# Patient Record
Sex: Male | Born: 1970 | Race: White | Hispanic: No | Marital: Single | Smoking: Current every day smoker
Health system: Southern US, Community
[De-identification: ages and names within clinical notes are randomized; demographics above are authoritative.]

## PROBLEM LIST (undated history)

## (undated) DIAGNOSIS — Z720 Tobacco use: Secondary | ICD-10-CM

## (undated) DIAGNOSIS — R002 Palpitations: Secondary | ICD-10-CM

## (undated) DIAGNOSIS — I10 Essential (primary) hypertension: Secondary | ICD-10-CM

## (undated) DIAGNOSIS — E663 Overweight: Secondary | ICD-10-CM

## (undated) HISTORY — DX: Tobacco use: Z72.0

## (undated) HISTORY — DX: Overweight: E66.3

## (undated) HISTORY — DX: Palpitations: R00.2

## (undated) HISTORY — PX: KNEE ARTHROSCOPY W/ ACL RECONSTRUCTION: SHX1858

---

## 2002-12-03 DIAGNOSIS — I10 Essential (primary) hypertension: Secondary | ICD-10-CM

## 2002-12-03 HISTORY — DX: Essential (primary) hypertension: I10

## 2006-12-03 DIAGNOSIS — Z720 Tobacco use: Secondary | ICD-10-CM

## 2006-12-03 HISTORY — DX: Tobacco use: Z72.0

## 2011-12-04 DIAGNOSIS — R002 Palpitations: Secondary | ICD-10-CM

## 2011-12-04 HISTORY — DX: Palpitations: R00.2

## 2012-01-13 ENCOUNTER — Other Ambulatory Visit: Payer: Self-pay

## 2012-01-13 ENCOUNTER — Emergency Department (HOSPITAL_COMMUNITY)
Admission: EM | Admit: 2012-01-13 | Discharge: 2012-01-14 | Disposition: A | Discharge status: Home / Self Care | Payer: Managed Care, Other (non HMO) | Attending: Emergency Medicine | Admitting: Emergency Medicine

## 2012-01-13 ENCOUNTER — Encounter (HOSPITAL_COMMUNITY): Payer: Self-pay

## 2012-01-13 ENCOUNTER — Emergency Department (HOSPITAL_COMMUNITY): Payer: Managed Care, Other (non HMO)

## 2012-01-13 DIAGNOSIS — R002 Palpitations: Secondary | ICD-10-CM

## 2012-01-13 DIAGNOSIS — I10 Essential (primary) hypertension: Secondary | ICD-10-CM | POA: Insufficient documentation

## 2012-01-13 DIAGNOSIS — F17200 Nicotine dependence, unspecified, uncomplicated: Secondary | ICD-10-CM | POA: Insufficient documentation

## 2012-01-13 HISTORY — DX: Essential (primary) hypertension: I10

## 2012-01-13 LAB — CBC
MCH: 32.7 pg (ref 26.0–34.0)
MCHC: 35.7 g/dL (ref 30.0–36.0)
Platelets: 274 10*3/uL (ref 150–400)

## 2012-01-13 NOTE — ED Notes (Addendum)
Monitor shows sinus tach without ectopic.  Denies any chest discomfort.  Skin warm and dry. Color good.  Appears anxious.

## 2012-01-13 NOTE — ED Notes (Signed)
Pt presents with tachycardia x 1 month. Pt states symptom is intermittent but recently has been constant. Pt denies chest pain and SOB.

## 2012-01-13 NOTE — ED Provider Notes (Signed)
History     CSN: [id number]  Arrival date & time 01/13/12  2240   First MD Initiated Contact with Patient 01/13/12 2310      Chief Complaint  Patient presents with  . Tachycardia    (Consider location/radiation/quality/duration/timing/severity/associated sxs/prior treatment) HPI Comments: Patient presents with palpitations and heart racing sensation is been intermittent for the past week. (not the past month as stated in triage note).  He finds that it worsens if he lays down he notices more at night. He thinks is related to his blood pressure medication change. He's been on triamterene/hydrochlorothiazide for the past 5 days after being on lisinopril for several years.  He does not have a regular doctor but sees a nurse at work. He denies any chest pain or shortness of breath, fever or cough. He denies a previous cardiac history and is not using any cocaine. The leg pain or swelling. Nothing makes his symptoms but are worse. Nothing appears to bring them on.   The history is provided by the patient.    Past Medical History  Diagnosis Date  . Hypertension     Past Surgical History  Procedure Date  . Anterior cruciate ligament repair     No family history on file.  History  Substance Use Topics  . Smoking status: Current Everyday Smoker -- 0.5 packs/day  . Smokeless tobacco: Not on file  . Alcohol Use: Yes     social      Review of Systems  Constitutional: Negative for fever, chills, activity change and appetite change.  HENT: Negative for congestion and rhinorrhea.   Eyes: Negative for visual disturbance.  Respiratory: Negative for cough, chest tightness and shortness of breath.   Cardiovascular: Positive for palpitations. Negative for chest pain.  Gastrointestinal: Negative for nausea, vomiting and abdominal pain.  Genitourinary: Negative for dysuria and hematuria.  Musculoskeletal: Negative for back pain.  Skin: Negative for rash.  Neurological: Negative for  headaches.    Allergies  Codeine  Home Medications  No current outpatient prescriptions on file.  BP 166/97  Pulse 102  Temp(Src) 98.5 F (36.9 C) (Oral)  Resp 18  Ht 6\' 3"  (1.905 m)  Wt 250 lb (113.399 kg)  BMI 31.25 kg/m2  SpO2 95%  Physical Exam  Constitutional: He is oriented to person, place, and time. He appears well-developed and well-nourished. No distress.  HENT:  Head: Normocephalic and atraumatic.  Mouth/Throat: Oropharynx is clear and moist. No oropharyngeal exudate.  Eyes: Conjunctivae and EOM are normal. Pupils are equal, round, and reactive to light.  Neck: Normal range of motion. Neck supple.  Cardiovascular: Normal rate, regular rhythm and normal heart sounds.   Pulmonary/Chest: Effort normal and breath sounds normal. No respiratory distress. He exhibits no tenderness.  Abdominal: Soft.  Musculoskeletal: Normal range of motion. He exhibits no edema and no tenderness.  Neurological: He is alert and oriented to person, place, and time. No cranial nerve deficit.  Skin: Skin is warm.    ED Course  Procedures (including critical care time)  Labs Reviewed  CBC - Abnormal; Notable for the following:    WBC 13.8 (*)    Hemoglobin 17.4 (*)    All other components within normal limits  DIFFERENTIAL - Abnormal; Notable for the following:    Neutro Abs 8.6 (*)    Monocytes Absolute 1.2 (*)    All other components within normal limits  COMPREHENSIVE METABOLIC PANEL - Abnormal; Notable for the following:    Glucose, Bld 122 (*)  Calcium 11.1 (*)    Total Bilirubin 0.2 (*)    GFR calc non Af Amer 86 (*)    All other components within normal limits  CARDIAC PANEL(CRET KIN+CKTOT+MB+TROPI) - Abnormal; Notable for the following:    Total CK 294 (*)    All other components within normal limits  URINALYSIS, ROUTINE W REFLEX MICROSCOPIC - Abnormal; Notable for the following:    Specific Gravity, Urine >1.030 (*)    All other components within normal limits   D-DIMER, QUANTITATIVE  URINE RAPID DRUG SCREEN (HOSP PERFORMED)  TSH  T4, FREE   Dg Chest 2 View  01/14/2012  *RADIOLOGY REPORT*  Clinical Data: Tachycardia.  Hypertension.  CHEST - 2 VIEW  Comparison: None.  Findings: Normal heart size and pulmonary vascularity.  No focal airspace consolidation in the lungs.  No blunting of costophrenic angles.  No pneumothorax.  Calcified granuloma in the left mid lung.  IMPRESSION: No evidence of active pulmonary disease.  Original Report Authenticated By: Marlon Pel, M.D.     1. Palpitations       MDM  Intermittent palpitations for the past month. No chest pain or shortness of breath. Sinus tachycardia.   Date: 01/13/2012  Rate: 99  Rhythm: normal sinus rhythm  QRS Axis: normal  Intervals: normal  ST/T Wave abnormalities: normal  Conduction Disutrbances:none  Narrative Interpretation: Septal Q waves, no comparison  Old EKG Reviewed: none available  Electrolytes normal, d-dimer negative.  Thyroid studies pending. No chest pain or shortness of breath. No further palpitations in the ED. Patient stable for followup with cardiology.        Glynn Octave, MD 01/14/12 [phone]

## 2012-01-14 LAB — T4, FREE: Free T4: 1.24 ng/dL (ref 0.80–1.80)

## 2012-01-14 LAB — COMPREHENSIVE METABOLIC PANEL
ALT: 29 U/L (ref 0–53)
AST: 23 U/L (ref 0–37)
CO2: 26 mEq/L (ref 19–32)
Chloride: 98 mEq/L (ref 96–112)
Creatinine, Ser: 1.05 mg/dL (ref 0.50–1.35)
GFR calc Af Amer: >90 mL/min (ref >90)
GFR calc non Af Amer: 86 mL/min — ABNORMAL LOW (ref >90)
Glucose, Bld: 122 mg/dL — ABNORMAL HIGH (ref 70–99)
Total Bilirubin: 0.2 mg/dL — ABNORMAL LOW (ref 0.3–1.2)

## 2012-01-14 LAB — RAPID URINE DRUG SCREEN, HOSP PERFORMED
Benzodiazepines: NOT DETECTED
Cocaine: NOT DETECTED

## 2012-01-14 LAB — URINALYSIS, ROUTINE W REFLEX MICROSCOPIC
Bilirubin Urine: NEGATIVE
Glucose, UA: NEGATIVE mg/dL
Ketones, ur: NEGATIVE mg/dL
pH: 5.5 (ref 5.0–8.0)

## 2012-01-14 LAB — TSH: TSH: 1.817 u[IU]/mL (ref 0.350–4.500)

## 2012-01-14 LAB — DIFFERENTIAL
Basophils Absolute: 0.1 10*3/uL (ref 0.0–0.1)
Neutro Abs: 8.6 10*3/uL — ABNORMAL HIGH (ref 1.7–7.7)
Neutrophils Relative %: 62 % (ref 43–77)

## 2012-01-14 LAB — CARDIAC PANEL(CRET KIN+CKTOT+MB+TROPI)
Relative Index: 1 (ref 0.0–2.5)
Total CK: 294 U/L — ABNORMAL HIGH (ref 7–232)
Troponin I: <0.3 ng/mL (ref <0.30)

## 2012-01-18 ENCOUNTER — Encounter: Payer: Self-pay | Admitting: Cardiology

## 2012-01-21 ENCOUNTER — Encounter: Payer: Self-pay | Admitting: Cardiology

## 2012-01-21 ENCOUNTER — Ambulatory Visit (INDEPENDENT_AMBULATORY_CARE_PROVIDER_SITE_OTHER): Payer: Managed Care, Other (non HMO) | Admitting: Cardiology

## 2012-01-21 DIAGNOSIS — I10 Essential (primary) hypertension: Secondary | ICD-10-CM

## 2012-01-21 DIAGNOSIS — R002 Palpitations: Secondary | ICD-10-CM

## 2012-01-21 DIAGNOSIS — Z720 Tobacco use: Secondary | ICD-10-CM | POA: Insufficient documentation

## 2012-01-21 MED ORDER — METOPROLOL SUCCINATE ER 50 MG PO TB24: 50.0000 mg | ORAL_TABLET | Freq: Every day | ORAL | Status: DC

## 2012-01-21 NOTE — Patient Instructions (Signed)
Your physician recommends that you schedule a follow-up appointment in: 1 month  Your physician has recommended you make the following change in your medication:  1 - STOP Trimterene/HCTZ 2 - START Toprol 50 mg daily  Your physician has requested that you regularly monitor and record your blood pressure readings at home. Please use the same machine at the same time of day to check your readings and record them to bring to your follow-up visit.

## 2012-01-21 NOTE — Progress Notes (Signed)
Patient ID: Craig Payne, male   DOB: 09-11-1971, 41 y.o.   MRN: [id number] HPI: Initial cardiology evaluation for this nice gentleman referred from the emergency department after presenting there with palpitations.  He was resting at home when he noted tachypalpitations with the sensation that his heart was beating forcefully and rapidly, but not irregularly.  Symptoms persisted long enough to allow him to come to the emergency department where he was told that his heart rhythm was unremarkable.  Symptoms were of enough concern to him that he would return to the emergency department immediately should they recur.  He denies associated dyspnea, diaphoresis nor nausea.  His work involves strenuous exercise, which he accomplishes without difficulty and without cardiopulmonary symptoms.  He reports increased stress in his life including at work and related to the death of close family members; however, he considers himself a person not prone to anxiety.  Current Outpatient Prescriptions  Medication Sig Dispense Refill  . busPIRone (BUSPAR) 15 MG tablet Take 15 mg by mouth 2 (two) times daily.      . sertraline (ZOLOFT) 25 MG tablet Take 25 mg by mouth daily.      . metoprolol succinate (TOPROL-XL) 50 MG 24 hr tablet Take 1 tablet (50 mg total) by mouth daily. Take with or immediately following a meal.  90 tablet  3   Allergies  Allergen Reactions  . Codeine Itching      Past Medical History  Diagnosis Date  . Hypertension   . Palpitations   . Tobacco abuse     5 pack years     Past Surgical History  Procedure Date  . Knee arthroscopy w/ acl reconstruction     Right     Family History  Problem Relation Age of Onset  . Diabetes Father   . Cancer Mother   . Kidney disease Father      History   Social History  . Marital Status: Single    Spouse Name: N/A    Number of Children: N/A  . Years of Education: N/A   Occupational History  . Factory work     [employer]   Social  History Main Topics  . Smoking status: Current Everyday Smoker -- 1.0 packs/day for 5 years  . Smokeless tobacco: Not on file   Comment: Started cigarette smoking in his 30s  . Alcohol Use: Yes     social  . Drug Use: No  . Sexually Active: Not on file   Other Topics Concern  . Not on file   Social History Narrative  . No narrative on file     ROS:      All other systems reviewed and are negative.  PHYSICAL EXAM: BP 157/99  Pulse 107  Resp 18  Ht 6\' 3"  (1.905 m)  Wt 116.574 kg (257 lb)  BMI 32.12 kg/m2  General-Well-developed; no acute distress Body Habitus-mildly to moderately overweight HEENT-[place]/AT; PERRL; EOM intact; conjunctiva and lids nl Neck-No JVD; no carotid bruits Endocrine-No thyromegaly Lungs-Clear lung fields; resonant percussion; normal I-to-E ratio Cardiovascular- normal PMI; normal S1 and S2 Abdomen-BS normal; soft and non-tender without masses or organomegaly Musculoskeletal-No deformities, cyanosis or clubbing Neurologic-Nl cranial nerves; symmetric strength and tone Skin- Warm, no significant lesions; tattoo over the right pectoral region Extremities-Nl distal pulses; no edema  EKG:  Tracing recorded 01/13/12 obtained and reviewed.  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   ASSESSMENT AND PLAN:  [place] Bing, MD 01/21/2012 2:02 PM

## 2012-02-03 NOTE — Assessment & Plan Note (Signed)
Blood pressure control has been suboptimal.  Diuretic will be discontinued and metoprolol started to both manage hypertension and to attempt to reduce palpitations.

## 2012-02-03 NOTE — Assessment & Plan Note (Signed)
Based upon the patient's history, it is uncertain whether he experienced a true arrhythmia.  If he did, there were no symptoms to suggest a significant risk of morbidity or mortality.  In the absence of recurrent symptoms, no diagnostic testing will be performed for the present.

## 2012-02-19 ENCOUNTER — Encounter: Payer: Self-pay | Admitting: *Deleted

## 2012-02-19 ENCOUNTER — Ambulatory Visit (INDEPENDENT_AMBULATORY_CARE_PROVIDER_SITE_OTHER): Payer: Managed Care, Other (non HMO) | Admitting: Cardiology

## 2012-02-19 ENCOUNTER — Encounter: Payer: Self-pay | Admitting: Cardiology

## 2012-02-19 VITALS — BP 150/94 | HR 88 | Resp 18 | Ht 75.0 in | Wt 258.0 lb

## 2012-02-19 DIAGNOSIS — F17200 Nicotine dependence, unspecified, uncomplicated: Secondary | ICD-10-CM

## 2012-02-19 DIAGNOSIS — E663 Overweight: Secondary | ICD-10-CM | POA: Insufficient documentation

## 2012-02-19 DIAGNOSIS — I10 Essential (primary) hypertension: Secondary | ICD-10-CM

## 2012-02-19 DIAGNOSIS — Z720 Tobacco use: Secondary | ICD-10-CM

## 2012-02-19 DIAGNOSIS — R002 Palpitations: Secondary | ICD-10-CM

## 2012-02-19 HISTORY — DX: Overweight: E66.3

## 2012-02-19 MED ORDER — VARENICLINE TARTRATE 0.5 MG X 11 & 1 MG X 42 PO MISC: ORAL | Status: DC

## 2012-02-19 MED ORDER — METOPROLOL SUCCINATE ER 100 MG PO TB24: 100.0000 mg | ORAL_TABLET | Freq: Every day | ORAL | Status: DC

## 2012-02-19 MED ORDER — CHLORTHALIDONE 25 MG PO TABS: ORAL_TABLET | ORAL | Status: DC

## 2012-02-19 MED ORDER — VARENICLINE TARTRATE 1 MG PO TABS: 1.0000 mg | ORAL_TABLET | Freq: Two times a day (BID) | ORAL | Status: DC

## 2012-02-19 NOTE — Assessment & Plan Note (Signed)
He discontinued cigarette smoking in the past with the assistance of Chantix-a prescription was provided to him.

## 2012-02-19 NOTE — Assessment & Plan Note (Addendum)
Blood pressure is improved, but remains suboptimal.  Metoprolol will be increased to 100 mg per day once patient's upper respiratory infection has resolved.  Chlorthalidone will be added at a dose of 12.5 mg per day.  The patient will continue to monitor blood pressure at home and contact his PCP should the values remain elevated.  A chemistry profile will be obtained in one month to exclude renal insufficiency or hypokalemia.  A lipid profile will be obtained at the same time.

## 2012-02-19 NOTE — Assessment & Plan Note (Signed)
In the absence of symptoms and with normal thyroid testing, no additional evaluation is warranted at present.  Patient is advised to contact me should palpitations recur.

## 2012-02-19 NOTE — Patient Instructions (Signed)
Your physician recommends that you schedule a follow-up appointment in: As needed  Your physician recommends that you return for lab work in: 1 month - You will receive a letter in the mail  Your physician has recommended you make the following change in your medication:  1 - Chantix as directed and stop smoking in 1 week 2 - Chlorthalidone 12.5 mg daily 3 - Increase Metoprolol to 100 mg daily  Weight loss of 20-30 pounds over the next 6 months

## 2012-02-19 NOTE — Progress Notes (Signed)
Patient ID: Craig Payne, male   DOB: 1970-12-04, 41 y.o.   MRN: [id number] HPI: The patient returns as scheduled for continuing assessment of hypertension, palpitations and tobacco abuse.  Since his last visit, he has done generally well.  Blood pressure control has been progressively improved on beta blocker, but remains suboptimal.  Heart rate has decreased from baseline, but remains fairly rapid.  Patient has experienced no additional palpitations and denies other cardiopulmonary symptoms.  He continues to smoke cigarettes, but has stopped for as long as 9 months in the past.  He has no history of asthma or significant chronic obstructive pulmonary disease exacerbations.  He never notices wheezing or dyspnea.  He developed upper respiratory infection symptoms roughly one week ago with fever and chills.  That illness is currently subsiding.  Prior to Admission medications   Medication Sig Start Date End Date Taking? Authorizing Provider  busPIRone (BUSPAR) 15 MG tablet Take 15 mg by mouth 2 (two) times daily.   Yes Historical Provider, MD  sertraline (ZOLOFT) 25 MG tablet Take 25 mg by mouth daily.   Yes Historical Provider, MD  chlorthalidone (HYGROTON) 25 MG tablet Take 1/2 tablet daily 02/19/12   Kathlen Brunswick, MD  metoprolol succinate (TOPROL-XL) 100 MG 24 hr tablet Take 1 tablet (100 mg total) by mouth daily. Take with or immediately following a meal. 02/19/12 02/18/13  Kathlen Brunswick, MD  varenicline (CHANTIX CONTINUING MONTH PAK) 1 MG tablet Take 1 tablet (1 mg total) by mouth 2 (two) times daily. 02/19/12 05/19/12  Kathlen Brunswick, MD  varenicline (CHANTIX STARTING MONTH PAK) 0.5 MG X 11 & 1 MG X 42 tablet Take one 0.5 mg tablet by mouth once daily for 3 days, then increase to one 0.5 mg tablet twice daily for 4 days, then increase to one 1 mg tablet twice daily. 02/19/12 05/19/12  Kathlen Brunswick, MD   Allergies  Allergen Reactions  . Codeine Itching    Past medical history, social  history, and family history reviewed and updated.  ROS: See history of present illness.  PHYSICAL EXAM: BP 150/94  Pulse 88  Resp 18  Ht 6\' 3"  (1.905 m)  Wt 117.028 kg (258 lb)  BMI 32.25 kg/m2  General-Well developed; no acute distress Body habitus-Moderately overweight and Neck-No JVD; no carotid bruits Lungs-clear lung fields; resonant to percussion Cardiovascular-normal PMI; normal S1 and S2; modest basilar systolic murmur Abdomen-normal bowel sounds; soft and non-tender without masses or organomegaly Musculoskeletal-No deformities, no cyanosis or clubbing Neurologic-Normal cranial nerves; symmetric strength and tone Skin-Warm, no significant lesions Extremities-distal pulses intact; no edema  ASSESSMENT AND PLAN:  [name], MD 02/19/2012 12:28 PM

## 2012-02-19 NOTE — Assessment & Plan Note (Signed)
Patient was advised that weight loss would be very difficult at the same time he is attempting to discontinue cigarette smoking.  I suggested caloric restriction and increased exercise to achieve a decrease of 20-30 pounds 6 months after discontinuation of tobacco use.

## 2012-03-21 ENCOUNTER — Other Ambulatory Visit: Payer: Self-pay | Admitting: *Deleted

## 2012-03-21 DIAGNOSIS — I10 Essential (primary) hypertension: Secondary | ICD-10-CM

## 2012-03-21 DIAGNOSIS — R002 Palpitations: Secondary | ICD-10-CM

## 2012-03-21 DIAGNOSIS — Z720 Tobacco use: Secondary | ICD-10-CM

## 2012-03-21 DIAGNOSIS — E663 Overweight: Secondary | ICD-10-CM

## 2012-03-28 ENCOUNTER — Encounter: Payer: Self-pay | Admitting: *Deleted

## 2012-04-01 ENCOUNTER — Encounter: Payer: Self-pay | Admitting: *Deleted

## 2012-04-29 ENCOUNTER — Other Ambulatory Visit: Payer: Self-pay | Admitting: Cardiology

## 2012-04-29 NOTE — Telephone Encounter (Signed)
PT WANTS TO KNOW HOW LONG HE IS TO TAKE THIS FOR

## 2012-04-30 ENCOUNTER — Other Ambulatory Visit: Payer: Self-pay | Admitting: *Deleted

## 2012-04-30 NOTE — Telephone Encounter (Signed)
Advised patient that after he finishes starter pack, he will need to pick up the continuation pack and complete it.  States he has a few more days of the starter pack and is doing well, thus far.

## 2012-05-06 ENCOUNTER — Other Ambulatory Visit: Payer: Self-pay

## 2012-05-06 MED ORDER — VARENICLINE TARTRATE 1 MG PO TABS: 1.0000 mg | ORAL_TABLET | Freq: Two times a day (BID) | ORAL | Status: AC

## 2012-12-06 IMAGING — CR DG CHEST 2V
2 series · 2 of 2 positions shown · non-contrast
Comparison: None.

CLINICAL DATA: Tachycardia.  Hypertension.

CHEST - 2 VIEW

[view not recorded (1 of 2)]
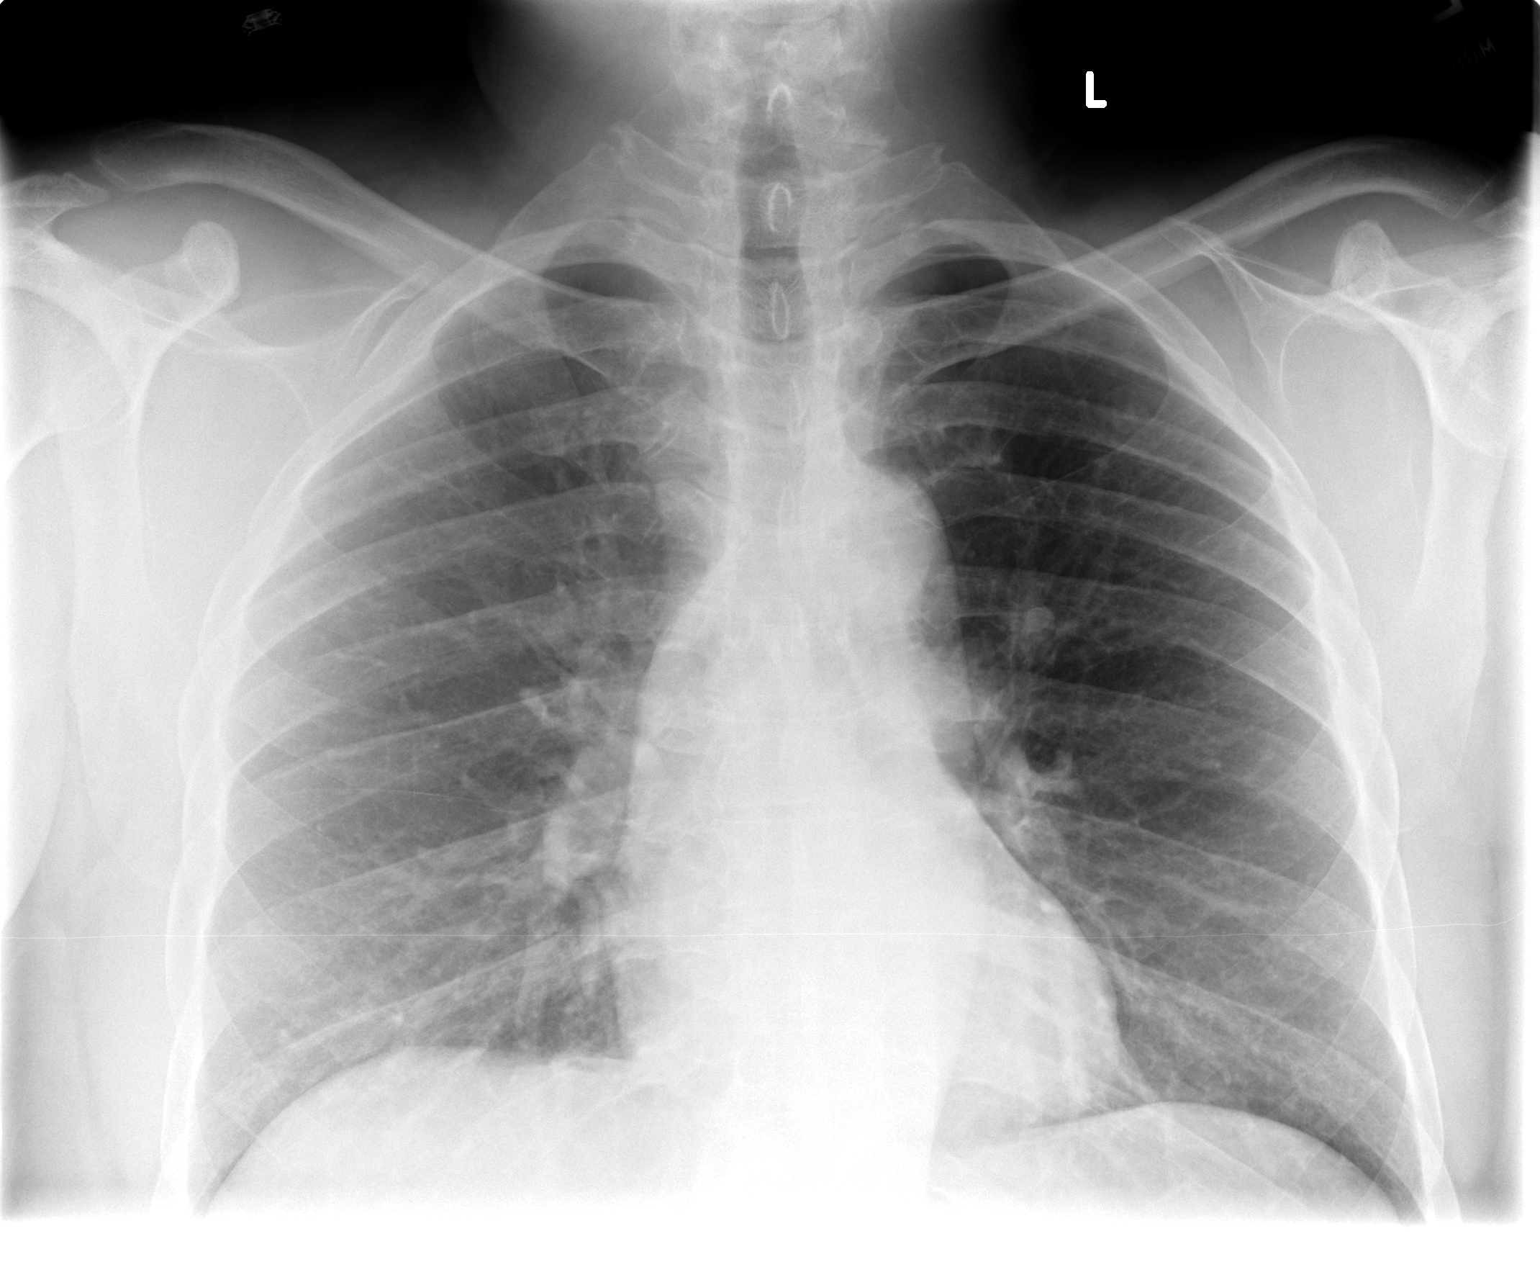

[view not recorded (2 of 2)]
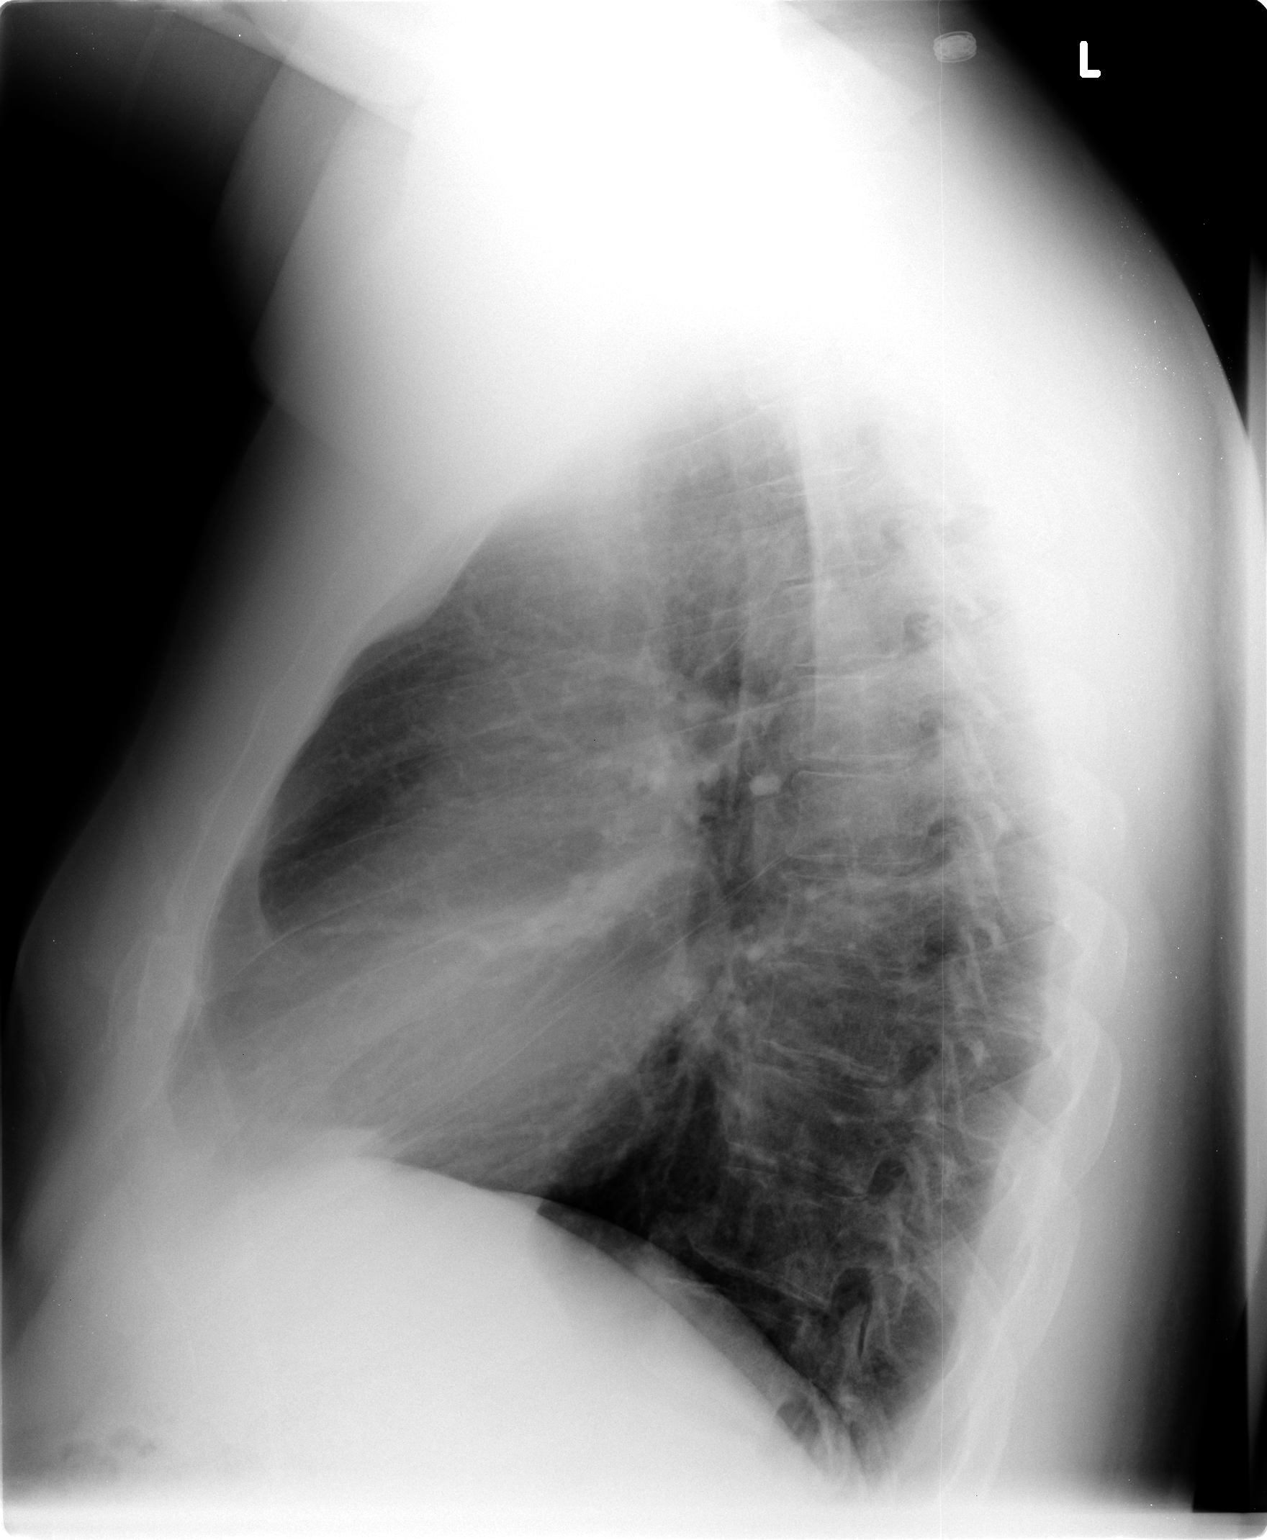

[2 of 2 positions shown; findings below may reference images not displayed]

FINDINGS: Normal heart size and pulmonary vascularity.  No focal
airspace consolidation in the lungs.  No blunting of costophrenic
angles.  No pneumothorax.  Calcified granuloma in the left mid
lung.
IMPRESSION: No evidence of active pulmonary disease.

## 2013-03-06 ENCOUNTER — Other Ambulatory Visit: Payer: Self-pay | Admitting: Cardiology

## 2013-03-08 ENCOUNTER — Other Ambulatory Visit: Payer: Self-pay | Admitting: Cardiology

## 2013-03-09 NOTE — Telephone Encounter (Signed)
Please contact PCP for further refills on ALL medications, as he is seen only on an as needed basis in our office.  Patient has been made aware, as well.

## 2020-02-12 ENCOUNTER — Ambulatory Visit: Payer: PRIVATE HEALTH INSURANCE | Attending: Internal Medicine

## 2020-02-12 ENCOUNTER — Other Ambulatory Visit: Payer: Self-pay

## 2020-02-12 DIAGNOSIS — Z23 Encounter for immunization: Secondary | ICD-10-CM

## 2020-02-12 NOTE — Progress Notes (Signed)
   Covid-19 Vaccination Clinic  Name:  Craig Payne    MRN: [id number] DOB: 13-Jun-1971  02/12/2020  Craig Payne was observed post Covid-19 immunization for 15 minutes without incident. He was provided with Vaccine Information Sheet and instruction to access the V-Safe system.   Craig Payne was instructed to call 911 with any severe reactions post vaccine: Marland Kitchen Difficulty breathing  . Swelling of face and throat  . A fast heartbeat  . A bad rash all over body  . Dizziness and weakness   Immunizations Administered    Name Date Dose VIS Date Route   Moderna COVID-19 Vaccine 02/12/2020  8:27 AM 0.5 mL 11/03/2019 Intramuscular   Manufacturer: Moderna   Lot: [id number]   NDC: 37944-461-90

## 2020-03-15 ENCOUNTER — Ambulatory Visit: Payer: PRIVATE HEALTH INSURANCE | Attending: Internal Medicine

## 2020-03-15 DIAGNOSIS — Z23 Encounter for immunization: Secondary | ICD-10-CM

## 2020-03-15 NOTE — Progress Notes (Signed)
   Covid-19 Vaccination Clinic  Name:  Craig Payne    MRN: [id number] DOB: [birth year]  03/15/2020  Mr. Say was observed post Covid-19 immunization for 15 minutes without incident. He was provided with Vaccine Information Sheet and instruction to access the V-Safe system.   [name] was instructed to call 911 with any severe reactions post vaccine: Marland Kitchen Difficulty breathing  . Swelling of face and throat  . A fast heartbeat  . A bad rash all over body  . Dizziness and weakness   Immunizations Administered    Name Date Dose VIS Date Route   Moderna COVID-19 Vaccine 03/15/2020  9:25 AM 0.5 mL 11/03/2019 Intramuscular   Manufacturer: Moderna   Lot: [id number]   NDC: 64847-207-21
